# Patient Record
Sex: Male | Born: 2000 | Race: White | Hispanic: No | Marital: Single | Smoking: Never smoker
Health system: Southern US, Community
[De-identification: ages and names within clinical notes are randomized; demographics above are authoritative.]

## PROBLEM LIST (undated history)

## (undated) DIAGNOSIS — T7840XA Allergy, unspecified, initial encounter: Secondary | ICD-10-CM

## (undated) DIAGNOSIS — J45909 Unspecified asthma, uncomplicated: Secondary | ICD-10-CM

## (undated) HISTORY — DX: Allergy, unspecified, initial encounter: T78.40XA

## (undated) HISTORY — DX: Unspecified asthma, uncomplicated: J45.909

## (undated) HISTORY — PX: NO PAST SURGERIES: SHX2092

---
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
## 2019-03-24 ENCOUNTER — Other Ambulatory Visit: Payer: Self-pay

## 2019-03-25 ENCOUNTER — Ambulatory Visit (INDEPENDENT_AMBULATORY_CARE_PROVIDER_SITE_OTHER): Payer: Medicaid Other

## 2019-03-25 DIAGNOSIS — Z23 Encounter for immunization: Secondary | ICD-10-CM | POA: Diagnosis not present

## 2020-09-07 ENCOUNTER — Ambulatory Visit: Payer: Medicaid Other

## 2020-09-07 ENCOUNTER — Ambulatory Visit: Payer: Medicaid Other | Admitting: Family Medicine

## 2020-09-07 ENCOUNTER — Other Ambulatory Visit: Payer: Self-pay

## 2020-09-07 ENCOUNTER — Encounter: Payer: Self-pay | Admitting: Family Medicine

## 2020-09-07 VITALS — BP 113/71 | HR 96 | Temp 97.5°F | Ht 68.0 in | Wt 127.0 lb

## 2020-09-07 DIAGNOSIS — Z23 Encounter for immunization: Secondary | ICD-10-CM

## 2020-09-07 DIAGNOSIS — L819 Disorder of pigmentation, unspecified: Secondary | ICD-10-CM | POA: Diagnosis not present

## 2020-09-07 DIAGNOSIS — G479 Sleep disorder, unspecified: Secondary | ICD-10-CM | POA: Diagnosis not present

## 2020-09-07 DIAGNOSIS — J45990 Exercise induced bronchospasm: Secondary | ICD-10-CM | POA: Diagnosis not present

## 2020-09-07 DIAGNOSIS — Z0001 Encounter for general adult medical examination with abnormal findings: Secondary | ICD-10-CM | POA: Diagnosis not present

## 2020-09-07 DIAGNOSIS — Z0000 Encounter for general adult medical examination without abnormal findings: Secondary | ICD-10-CM

## 2020-09-07 DIAGNOSIS — R42 Dizziness and giddiness: Secondary | ICD-10-CM

## 2020-09-07 MED ORDER — ALBUTEROL SULFATE HFA 108 (90 BASE) MCG/ACT IN AERS: 2.0000 | INHALATION_SPRAY | Freq: Four times a day (QID) | RESPIRATORY_TRACT | 2 refills | Status: AC | PRN

## 2020-09-07 NOTE — Progress Notes (Signed)
Dalton Nixon is a 20 y.o. male presents to office today for annual physical exam examination.    Concerns today include: 1.  Mole on back Patient reports that his mother noticed a mole on his left upper back.  Apparently a cousin had something that looked like skin cancer and he wanted to get this further evaluated.  Not sure if the change in shape, size or color.  No reports of spontaneous bleeding  2.  Sleep difficulty Patient reports has been having ongoing sleep difficulties.  He has difficulty both falling asleep and staying asleep.  He put his phone on silent mood so does not feel that he is getting disturbed from that standpoint but often his mind will run and he will think about various things that prevents him from going to sleep.  He gets into bed around 11 but is unable to fall asleep until about 2:00 in the morning.  He does admit to drinking 2 sodas per day but he is actively working on reducing these and increasing water.  3.  Reactive airway disease/asthma Patient needs a new albuterol inhaler.  He reports feeling short of breath roughly 3 days/week.  He no longer smokes.  He was a previous vape smoker but has been off of vape for at least 2 to 3 months now.  4.  Dizziness Patient reports that he does have occasional dizziness and headaches at nighttime.  He feels that he is hydrating adequately.  Occupation: not employed, not in school, draws/paints for hobby, Marital status: single, Substance use: none Diet: fair, Exercise: no structured Last eye exam: n/a Last dental exam: needs, had wisdom teeth removed Immunizations needed:  Immunization History  Administered Date(s) Administered  . DTaP 11/15/1999, 01/18/2000, 03/13/2000, 01/27/2001, 10/20/2003  . H1N1 06/08/2008, 07/13/2008  . Hepatitis B 10/19/1999, 11/15/1999, 03/13/2000  . HiB (PRP-OMP) 11/16/1999, 01/17/2000, 03/13/2000, 01/27/2001  . IPV 11/15/1999, 01/18/2000, 01/27/2001, 10/20/2003  . Influenza,inj,Quad  PF,6+ Mos 04/06/2013, 03/25/2019  . Influenza-Unspecified 07/08/2003, 05/09/2007, 03/22/2008, 04/26/2009, 04/03/2010, 04/12/2011, 03/27/2012, 07/07/2014, 05/11/2015, 04/06/2016, 04/08/2017  . MMR 09/05/2000, 10/20/2003  . Moderna Sars-Covid-2 Vaccination 09/07/2020  . Pneumococcal Conjugate-13 11/16/1999, 01/17/2000, 03/13/2000, 01/27/2001  . Td 09/11/2010  . Tdap 09/11/2010  . Varicella 09/05/2000, 11/23/2014     Past Medical History:  Diagnosis Date  . Allergy   . Asthma    Social History   Socioeconomic History  . Marital status: Single    Spouse name: Not on file  . Number of children: Not on file  . Years of education: Not on file  . Highest education level: Not on file  Occupational History  . Not on file  Tobacco Use  . Smoking status: Never Smoker  . Smokeless tobacco: Never Used  Vaping Use  . Vaping Use: Never used  Substance and Sexual Activity  . Alcohol use: No  . Drug use: No  . Sexual activity: Never  Other Topics Concern  . Not on file  Social History Narrative  . Not on file   Social Determinants of Health   Financial Resource Strain: Not on file  Food Insecurity: Not on file  Transportation Needs: Not on file  Physical Activity: Not on file  Stress: Not on file  Social Connections: Not on file  Intimate Partner Violence: Not on file   Past Surgical History:  Procedure Laterality Date  . NO PAST SURGERIES     Family History  Problem Relation Age of Onset  . Anxiety disorder Mother  No current outpatient medications on file.  No Known Allergies   ROS: Review of Systems Pertinent items noted in HPI and remainder of comprehensive ROS otherwise negative.    Physical exam BP 113/71   Pulse 96   Temp (!) 97.5 F (36.4 C) (Temporal)   Ht $R'5\' 8"'KA$  (1.727 m)   Wt 127 lb (57.6 kg)   BMI 19.31 kg/m  General appearance: alert, cooperative, appears stated age and no distress Head: Normocephalic, without obvious abnormality,  atraumatic Eyes: negative findings: lids and lashes normal, conjunctivae and sclerae normal, corneas clear and pupils equal, round, reactive to light and accomodation Ears: normal TM's and external ear canals both ears Nose: Nares normal. Septum midline. Mucosa normal. No drainage or sinus tenderness. Throat: lips, mucosa, and tongue normal; teeth and gums normal Neck: no adenopathy, supple, symmetrical, trachea midline and thyroid not enlarged, symmetric, no tenderness/mass/nodules Back: symmetric, no curvature. ROM normal. No CVA tenderness. Lungs: clear to auscultation bilaterally Chest wall: no tenderness Heart: regular rate and rhythm, S1, S2 normal, no murmur, click, rub or gallop Abdomen: soft, non-tender; bowel sounds normal; no masses,  no organomegaly Extremities: extremities normal, atraumatic, no cyanosis or edema Pulses: 2+ and symmetric Skin: Skin color, texture, turgor normal. No rashes or lesions or 2-1/2 mm x 2 and half millimeter pigmented skin lesion noted along the left upper back that has spotty pigmentation.  No appreciable hypervascularity Lymph nodes: Cervical, supraclavicular, and axillary nodes normal. Neurologic: Alert and oriented X 3, normal strength and tone. Normal symmetric reflexes. Normal coordination and gait Psych: Affect somewhat flat.  Patient is quiet.  He is pleasant.  Eye contact fair. Depression screen Dalton Nixon 2/9 09/07/2020 03/19/2018 09/24/2017  Decreased Interest 0 2 0  Down, Depressed, Hopeless 0 0 0  PHQ - 2 Score 0 2 0  Altered sleeping 0 3 -  Tired, decreased energy 0 2 -  Change in appetite 0 0 -  Feeling bad or failure about yourself  0 0 -  Trouble concentrating 0 0 -  Moving slowly or fidgety/restless 0 0 -  Suicidal thoughts 0 0 -  PHQ-9 Score 0 7 -  Difficult doing work/chores - Not difficult at all -   No flowsheet data found.  Assessment/ Plan: Dalton Nixon here for annual physical exam.   Annual physical exam  COVID-19 vaccine  administered - Plan: Moderna SARS-CoV-2 Vaccine  Encounter for immunization - Plan: Moderna SARS-CoV-2 Vaccine  Exercise-induced asthma - Plan: albuterol (VENTOLIN HFA) 108 (90 Base) MCG/ACT inhaler  Pigmentation abnormality of skin - Plan: Ambulatory referral to Dermatology  Sleeping difficulties  Dizziness  Patient was given his code vaccination during today's visit and there were no immediate complications.  He was monitored for at least 15 minutes and discharged home with instructions for repeat injection in 4 weeks at pharmacy  Lungs were clear to auscultation.  Initially had consider controller medicine.  I am going to give him albuterol.  We discussed that if symptoms are occurring more than 3 days/week or at nighttime, will consider something like Symbicort or Flovent for seasonal use in addition to his albuterol  Lesion likely a nevi but given its asymmetric pigmentation I placed a referral to dermatology for further evaluation  I suspect that there is a big component of sleep hygiene as an issue for the sleep difficulties.  I have given him a handout on how to improve sleep hygiene.  I would like to reconvene with him in the next 3 to  4 weeks for recheck.  If persistent issues, would strongly consider something like Atarax or melatonin.  No focal neurologic deficits on exam.  I wonder if the dizziness may be related to insufficient hydration.  His ENT exam was unremarkable.  Handout on healthy lifestyle choices, including diet (rich in fruits, vegetables and lean meats and low in salt and simple carbohydrates) and exercise (at least 30 minutes of moderate physical activity daily).  Breunna Nordmann M. Lajuana Ripple, DO

## 2020-09-07 NOTE — Progress Notes (Signed)
   Covid-[facility] Vaccination [facility]  Name:  Dalton Nixon    MRN: [id number] DOB: 18-Oct-2000  09/07/2020  [name] was observed post Covid-19 immunization for 15 minutes without incident. He was provided with Vaccine Information Sheet and instruction to access the V-Safe system.   [name] was instructed to call 911 with any severe reactions post vaccine: Marland Kitchen Difficulty breathing  . Swelling of face and throat  . A fast heartbeat  . A bad rash all over body  . Dizziness and weakness

## 2020-09-07 NOTE — Patient Instructions (Signed)
Preventive Care 18-21 Years Old, Male Preventive care refers to lifestyle choices and visits with your health care provider that can promote health and wellness. At this stage in your life, you may start seeing a primary care physician instead of a pediatrician. It is important to take responsibility for your health and well-being. Preventive care for young adults includes:  A yearly physical exam. This is also called an annual wellness visit.  Regular dental and eye exams.  Immunizations.  Screening for certain conditions.  Healthy lifestyle choices, such as: ? Eating a healthy diet. ? Getting regular exercise. ? Not using drugs or products that contain nicotine and tobacco. ? Limiting alcohol use. What can I expect for my preventive care visit? Physical exam Your health care provider may check your:  Height and weight. These may be used to calculate your BMI (body mass index). BMI is a measurement that tells if you are at a healthy weight.  Heart rate and blood pressure.  Body temperature.  Skin for abnormal spots. Counseling Your health care provider may ask you questions about your:  Past medical problems.  Family's medical history.  Alcohol, tobacco, and drug use.  Home life and relationship well-being.  Access to firearms.  Emotional well-being.  Diet, exercise, and sleep habits.  Sexual activity and sexual health. What immunizations do I need? Vaccines are usually given at various ages, according to a schedule. Your health care provider will recommend vaccines for you based on your age, medical history, and lifestyle or other factors, such as travel or where you work.   What tests do I need? Blood tests  Lipid and cholesterol levels. These may be checked every 5 years starting at age 20.  Hepatitis C test.  Hepatitis B test. Screening  Genital exam to check for testicular cancer or hernias.  STD (sexually transmitted disease) testing, if you are at  risk. Other tests  Tuberculosis skin test.  Vision and hearing tests.  Skin exam. Talk with your health care provider about your test results, treatment options, and if necessary, the need for more tests. Follow these instructions at home: Eating and drinking  Eat a healthy diet that includes fresh fruits and vegetables, whole grains, lean protein, and low-fat dairy products.  Drink enough fluid to keep your urine pale yellow.  Do not drink alcohol if: ? Your health care provider tells you not to drink. ? You are under the legal drinking age. In the [place] the legal drinking age is 21.  If you drink alcohol: ? Limit how much you use to 0-2 drinks a day. ? Be aware of how much alcohol is in your drink. In the [place] one drink equals one 12 oz bottle of beer (355 mL), one 5 oz glass of wine (148 mL), or one 1 oz glass of hard liquor (44 mL).   Lifestyle  Take daily care of your teeth and gums. Brush your teeth every morning and night with fluoride toothpaste. Floss one time each day.  Stay active. Exercise for at least 30 minutes 5 or more days of the week.  Do not use any products that contain nicotine or tobacco, such as cigarettes, e-cigarettes, and chewing tobacco. If you need help quitting, ask your health care provider.  Do not use drugs.  If you are sexually active, practice safe sex. Use a condom or other form of protection to prevent STIs (sexually transmitted infections).  Find healthy ways to cope with stress, such as: ? Meditation, yoga,   or listening to music. ? Journaling. ? Talking to a trusted person. ? Spending time with friends and family. Safety  Always wear your seat belt while driving or riding in a vehicle.  Do not drive: ? If you have been drinking alcohol. Do not ride with someone who has been drinking. ? When you are tired or distracted. ? While texting.  Wear a helmet and other protective equipment during sports activities.  If you have  firearms in your house, make sure you follow all gun safety procedures.  Seek help if you have been bullied, physically abused, or sexually abused.  Use the Internet responsibly to avoid dangers, such as online bullying and online sex predators. What's next?  Go to your health care provider once a year for an annual wellness visit.  Ask your health care provider how often you should have your eyes and teeth checked.  Stay up to date on all vaccines. This information is not intended to replace advice given to you by your health care provider. Make sure you discuss any questions you have with your health care provider. Document Revised: 03/04/2019 Document Reviewed: 06/12/2018 Elsevier Patient Education  2021 Elsevier Inc.  

## 2020-09-08 ENCOUNTER — Telehealth: Payer: Self-pay

## 2020-09-08 NOTE — Telephone Encounter (Signed)
Pts mom called stating that they want pt to be referred for dermatology somewhere in [place] or [place]; not in [place]. Says pt was unsure when he was asked at appt yesterday.

## 2020-09-08 NOTE — Telephone Encounter (Signed)
Dalton Nixon, referral was placed yesterday by Dr. Reece Agar

## 2020-10-07 ENCOUNTER — Ambulatory Visit: Payer: Medicaid Other | Admitting: Family Medicine

## 2020-11-01 ENCOUNTER — Ambulatory Visit: Payer: Medicaid Other | Admitting: Family Medicine

## 2020-11-01 NOTE — Progress Notes (Deleted)
   Subjective: CC: Bronchospasm, Insomnia PCP: Raliegh Ip, DO URK:YHCW Judie Petit [place] is a 20 y.o. male presenting to clinic today for:  1. Bronchospasm ***  2. Insomnia ***   ROS: Per HPI  No Known Allergies Past Medical History:  Diagnosis Date  . Allergy   . Asthma     Current Outpatient Medications:  .  albuterol (VENTOLIN HFA) 108 (90 Base) MCG/ACT inhaler, Inhale 2 puffs into the lungs every 6 (six) hours as needed for wheezing or shortness of breath., Disp: 1 each, Rfl: 2 Social History   Socioeconomic History  . Marital status: Single    Spouse name: Not on file  . Number of children: Not on file  . Years of education: Not on file  . Highest education level: Not on file  Occupational History  . Not on file  Tobacco Use  . Smoking status: Never Smoker  . Smokeless tobacco: Never Used  Vaping Use  . Vaping Use: Never used  Substance and Sexual Activity  . Alcohol use: No  . Drug use: No  . Sexual activity: Never  Other Topics Concern  . Not on file  Social History Narrative  . Not on file   Social Determinants of Health   Financial Resource Strain: Not on file  Food Insecurity: Not on file  Transportation Needs: Not on file  Physical Activity: Not on file  Stress: Not on file  Social Connections: Not on file  Intimate Partner Violence: Not on file   Family History  Problem Relation Age of Onset  . Anxiety disorder Mother     Objective: Office vital signs reviewed. There were no vitals taken for this visit.  Physical Examination:  General: Awake, alert, *** nourished, No acute distress HEENT: Normal    Neck: No masses palpated. No lymphadenopathy    Ears: Tympanic membranes intact, normal light reflex, no erythema, no bulging    Eyes: PERRLA, extraocular membranes intact, sclera ***    Nose: nasal turbinates moist, *** nasal discharge    Throat: moist mucus membranes, no erythema, *** tonsillar exudate.  Airway is patent Cardio:  regular rate and rhythm, S1S2 heard, no murmurs appreciated Pulm: clear to auscultation bilaterally, no wheezes, rhonchi or rales; normal work of breathing on room air GI: soft, non-tender, non-distended, bowel sounds present x4, no hepatomegaly, no splenomegaly, no masses GU: external vaginal tissue ***, cervix ***, *** punctate lesions on cervix appreciated, *** discharge from cervical os, *** bleeding, *** cervical motion tenderness, *** abdominal/ adnexal masses Extremities: warm, well perfused, No edema, cyanosis or clubbing; +*** pulses bilaterally MSK: *** gait and *** station Skin: dry; intact; no rashes or lesions Neuro: *** Strength and light touch sensation grossly intact, *** DTRs ***/4  Assessment/ Plan: 20 y.o. male   ***  No orders of the defined types were placed in this encounter.  No orders of the defined types were placed in this encounter.    Raliegh Ip, DO [facility] [phone]

## 2021-01-17 ENCOUNTER — Ambulatory Visit: Payer: Medicaid Other | Admitting: Family Medicine

## 2021-03-03 ENCOUNTER — Ambulatory Visit: Payer: Medicaid Other | Admitting: Family Medicine

## 2021-11-15 ENCOUNTER — Encounter: Payer: Self-pay | Admitting: Family Medicine

## 2021-11-15 ENCOUNTER — Ambulatory Visit: Payer: BC Managed Care – PPO | Admitting: Family Medicine

## 2021-11-15 VITALS — BP 105/55 | HR 64 | Temp 97.6°F | Ht 68.0 in | Wt 123.0 lb

## 2021-11-15 DIAGNOSIS — F17200 Nicotine dependence, unspecified, uncomplicated: Secondary | ICD-10-CM

## 2021-11-15 DIAGNOSIS — Z1159 Encounter for screening for other viral diseases: Secondary | ICD-10-CM

## 2021-11-15 DIAGNOSIS — R229 Localized swelling, mass and lump, unspecified: Secondary | ICD-10-CM | POA: Diagnosis not present

## 2021-11-15 DIAGNOSIS — Z13220 Encounter for screening for lipoid disorders: Secondary | ICD-10-CM

## 2021-11-15 DIAGNOSIS — Z130 Encounter for screening for diseases of the blood and blood-forming organs and certain disorders involving the immune mechanism: Secondary | ICD-10-CM | POA: Diagnosis not present

## 2021-11-15 DIAGNOSIS — Z114 Encounter for screening for human immunodeficiency virus [HIV]: Secondary | ICD-10-CM | POA: Diagnosis not present

## 2021-11-15 DIAGNOSIS — Z13228 Encounter for screening for other metabolic disorders: Secondary | ICD-10-CM | POA: Diagnosis not present

## 2021-11-15 NOTE — Progress Notes (Signed)
? ?Subjective: ?CC: Hand swelling ?PCP: Janora Norlander, DO ?NAT:FTDD Dalton Nixon is a 21 y.o. male presenting to clinic today for: ? ?1.  Swelling ?Patient reports that he has had some mild swelling at just below the PIP joint on the right hand.  He is right-hand dominant.  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  He denies any pain, sensory changes, weakness in that hand.  No itching.  No significant discoloration.  He feels that it is a callus but his mother was really worried that he is here to have that checked out today ? ?His mother also asked for "full blood work".  He denies any concerns today however. ? ? ?ROS: Per HPI ? ?No Known Allergies ?Past Medical History:  ?Diagnosis Date  ? Allergy   ? Asthma   ? ? ?Current Outpatient Medications:  ?  albuterol (VENTOLIN HFA) 108 (90 Base) MCG/ACT inhaler, Inhale 2 puffs into the lungs every 6 (six) hours as needed for wheezing or shortness of breath., Disp: 1 each, Rfl: 2 ?Social History  ? ?Socioeconomic History  ? Marital status: Single  ?  Spouse name: Not on file  ? Number of children: Not on file  ? Years of education: Not on file  ? Highest education level: Not on file  ?Occupational History  ? Not on file  ?Tobacco Use  ? Smoking status: Never  ? Smokeless tobacco: Never  ?Vaping Use  ? Vaping Use: Never used  ?Substance and Sexual Activity  ? Alcohol use: No  ? Drug use: No  ? Sexual activity: Never  ?Other Topics Concern  ? Not on file  ?Social History Narrative  ? Not on file  ? ?Social Determinants of Health  ? ?Financial Resource Strain: Not on file  ?Food Insecurity: Not on file  ?Transportation Needs: Not on file  ?Physical Activity: Not on file  ?Stress: Not on file  ?Social Connections: Not on file  ?Intimate Partner Violence: Not on file  ? ?Family History  ?Problem Relation Age of Onset  ? Anxiety disorder Mother   ? ? ?Objective: ?Office vital signs reviewed. ?BP (!) 105/55    Pulse 64   Temp 97.6 ?F (36.4 ?C)   Ht _0  (1.727 m)   Wt 123 lb (55.8 kg)   SpO2 96%   BMI 18.70 kg/m?  ? ?Physical Examination:  ?General: Awake, alert, thin male, No acute distress ?HEENT: Sclera white.  Dentition fair.  Moist mucous membranes ?Cardio: regular rate and rhythm, S1S2 heard, no murmurs appreciated ?Pulm: clear to auscultation bilaterally, no wheezes, rhonchi or rales; normal work of breathing on room air ?MSK: Normal gait and station ? Right hand" he has a very small area of soft tissue swelling noted on the dorsal aspect of the right hand just inferior to the PIP joint of the second digit.  There is no tenderness to palpation.  There is only mild hyperpigmentation in this area with associated thickening of the skin consistent with callus formation. ? ?Assessment/ Plan: ?21 y.o. male  ? ?Soft tissue swelling of non-joint region - Plan: CMP14+EGFR, TSH, CBC, CBC, TSH, CMP14+EGFR ? ?Smoker - Plan: CBC, CBC ? ?Screening, anemia, deficiency, iron - Plan: CBC, CBC ? ?Screening for metabolic disorder ? ?Screening, lipid - Plan: Lipid panel, Lipid panel ? ?Screening for HIV (human immunodeficiency virus) - Plan: HIV antibody (with reflex), HIV antibody (with reflex) ? ?Encounter for hepatitis C screening test  for low risk patient - Plan: Hepatitis C antibody, Hepatitis C antibody ? ?Suspect that soft tissue swelling is reactive in the setting of his work obligations.  We discussed that if it starts bothering him he certainly could use ice to the affected area as well as Voltaren gel to reduce swelling.  Does not appear to be involving the joint at all. ? ?Nonfasting labs obtained for patient as per his mother's request ? ?No orders of the defined types were placed in this encounter. ? ?No orders of the defined types were placed in this encounter. ? ? ? ?Janora Norlander, DO ?[place] ?[phone] ? ? ?

## 2021-11-15 NOTE — Patient Instructions (Signed)
Labs have been ordered but we discussed that this may be abnormal since you ate a biscuit this morning. ? ?Soft tissue swelling noted adjacent to your finger joint is likely reactive.  I would not be surprised if he developed a little cyst in this region.  It is most ongoing due to the repetitive irritation that it is getting with your work duties.  You can use ice in this area to reduce swelling.  You may use topical Voltaren gel if needed as well up to 2 or 3 times a day.  However if its not causing you any pain or irritation there is really no reason for you to do anything at all besides watch it ?

## 2021-11-16 LAB — LIPID PANEL
Chol/HDL Ratio: 2.4 ratio (ref 0.0–5.0)
Cholesterol, Total: 96 mg/dL — ABNORMAL LOW (ref 100–199)
HDL: 40 mg/dL (ref >39)
LDL Chol Calc (NIH): 44 mg/dL (ref 0–99)
Triglycerides: 44 mg/dL (ref 0–149)
VLDL Cholesterol Cal: 12 mg/dL (ref 5–40)

## 2021-11-16 LAB — CMP14+EGFR
ALT: 10 IU/L (ref 0–44)
AST: 15 IU/L (ref 0–40)
Albumin/Globulin Ratio: 2.2 (ref 1.2–2.2)
Albumin: 4.9 g/dL (ref 4.1–5.2)
Alkaline Phosphatase: 64 IU/L (ref 44–121)
BUN/Creatinine Ratio: 11 (ref 9–20)
BUN: 9 mg/dL (ref 6–20)
Bilirubin Total: 0.6 mg/dL (ref 0.0–1.2)
CO2: 25 mmol/L (ref 20–29)
Calcium: 9.4 mg/dL (ref 8.7–10.2)
Chloride: 103 mmol/L (ref 96–106)
Creatinine, Ser: 0.82 mg/dL (ref 0.76–1.27)
Globulin, Total: 2.2 g/dL (ref 1.5–4.5)
Glucose: 95 mg/dL (ref 70–99)
Potassium: 4.3 mmol/L (ref 3.5–5.2)
Sodium: 141 mmol/L (ref 134–144)
Total Protein: 7.1 g/dL (ref 6.0–8.5)
eGFR: 127 mL/min/{1.73_m2} (ref >59)

## 2021-11-16 LAB — CBC
Hematocrit: 43.3 % (ref 37.5–51.0)
Hemoglobin: 14.6 g/dL (ref 13.0–17.7)
MCH: 31.1 pg (ref 26.6–33.0)
MCHC: 33.7 g/dL (ref 31.5–35.7)
MCV: 92 fL (ref 79–97)
Platelets: 204 10*3/uL (ref 150–450)
RBC: 4.69 x10E6/uL (ref 4.14–5.80)
RDW: 12.6 % (ref 11.6–15.4)
WBC: 3.5 10*3/uL (ref 3.4–10.8)

## 2021-11-16 LAB — TSH: TSH: 2.97 u[IU]/mL (ref 0.450–4.500)

## 2021-11-16 LAB — HIV ANTIBODY (ROUTINE TESTING W REFLEX): HIV Screen 4th Generation wRfx: NONREACTIVE

## 2021-11-16 LAB — HEPATITIS C ANTIBODY: Hep C Virus Ab: NONREACTIVE

## 2022-02-16 ENCOUNTER — Encounter: Payer: Self-pay | Admitting: Nurse Practitioner

## 2022-02-16 ENCOUNTER — Ambulatory Visit: Payer: BC Managed Care – PPO | Admitting: Nurse Practitioner

## 2022-02-16 DIAGNOSIS — R051 Acute cough: Secondary | ICD-10-CM

## 2022-02-16 MED ORDER — AMOXICILLIN-POT CLAVULANATE 875-125 MG PO TABS: 1.0000 | ORAL_TABLET | Freq: Two times a day (BID) | ORAL | 0 refills | Status: AC

## 2022-02-16 NOTE — Patient Instructions (Signed)
Sore Throat When you have a sore throat, your throat may feel: Tender. Burning. Irritated. Scratchy. Painful when you swallow. Painful when you talk. Many things can cause a sore throat, such as: An infection. Allergies. Dry air. Smoke or pollution. Radiation treatment for cancer. Gastroesophageal reflux disease (GERD). A tumor. A sore throat can be the first sign of another sickness. It can happen with other problems, like: Coughing. Sneezing. Fever. Swelling of the glands in the neck. Most sore throats go away without treatment. Follow these instructions at home:     Medicines Take over-the-counter and prescription medicines only as told by your doctor. Children often get sore throats. Do not give your child aspirin. Use throat sprays to soothe your throat as told by your health care provider. Managing pain To help with pain: Sip warm liquids, such as broth, herbal tea, or warm water. Eat or drink cold or frozen liquids, such as frozen ice pops. Rinse your mouth (gargle) with a salt water mixture 3-4 times a day or as needed. To make salt water, dissolve -1 tsp (3-6 g) of salt in 1 cup (237 mL) of warm water. Do not swallow this mixture. Suck on hard candy or throat lozenges. Put a cool-mist humidifier in your bedroom at night. Sit in the bathroom with the door closed for 5-10 minutes while you run hot water in the shower. General instructions Do not smoke or use any products that contain nicotine or tobacco. If you need help quitting, ask your doctor. Get plenty of rest. Drink enough fluid to keep your pee (urine) pale yellow. Wash your hands often for at least 20 seconds with soap and water. If soap and water are not available, use hand sanitizer. Contact a doctor if: You have a fever for more than 2-3 days. You keep having symptoms for more than 2-3 days. Your throat does not get better in 7 days. You have a fever and your symptoms suddenly get worse. Your  child who is 3 months to 27 years old has a temperature of 102.77F (39C) or higher. Get help right away if: You have trouble breathing. You cannot swallow fluids, soft foods, or your spit. You have swelling in your throat or neck that gets worse. You feel like you may vomit (nauseous) and this feeling lasts a long time. You cannot stop vomiting. These symptoms may be an emergency. Get help right away. Call your local emergency services (911 in the [place] Do not wait to see if the symptoms will go away. Do not drive yourself to the hospital. Summary A sore throat is a painful, burning, irritated, or scratchy throat. Many things can cause a sore throat. Take over-the-counter medicines only as told by your doctor. Get plenty of rest. Drink enough fluid to keep your pee (urine) pale yellow. Contact a doctor if your symptoms get worse or your sore throat does not get better within 7 days. This information is not intended to replace advice given to you by your health care provider. Make sure you discuss any questions you have with your health care provider. Document Revised: 09/14/2020 Document Reviewed: 09/14/2020 Elsevier Patient Education  2023 Elsevier Inc. Sinus Infection, Adult A sinus infection is soreness and swelling (inflammation) of your sinuses. Sinuses are hollow spaces in the bones around your face. They are located: Around your eyes. In the middle of your forehead. Behind your nose. In your cheekbones. Your sinuses and nasal passages are lined with a fluid called mucus. Mucus drains out of  your sinuses. Swelling can trap mucus in your sinuses. This lets germs (bacteria, virus, or fungus) grow, which leads to infection. Most of the time, this condition is caused by a virus. What are the causes? Allergies. Asthma. Germs. Things that block your nose or sinuses. Growths in the nose (nasal polyps). Chemicals or irritants in the air. A fungus. This is rare. What increases the  risk? Having a weak body defense system (immune system). Doing a lot of swimming or diving. Using nasal sprays too much. Smoking. What are the signs or symptoms? The main symptoms of this condition are pain and a feeling of pressure around the sinuses. Other symptoms include: Stuffy nose (congestion). This may make it hard to breathe through your nose. Runny nose (drainage). Soreness, swelling, and warmth in the sinuses. A cough that may get worse at night. Being unable to smell and taste. Mucus that collects in the throat or the back of the nose (postnasal drip). This may cause a sore throat or bad breath. Being very tired (fatigued). A fever. How is this diagnosed? Your symptoms. Your medical history. A physical exam. Tests to find out if your condition is short-term (acute) or long-term (chronic). Your doctor may: Check your nose for growths (polyps). Check your sinuses using a tool that has a light on one end (endoscope). Check for allergies or germs. Do imaging tests, such as an MRI or CT scan. How is this treated? Treatment for this condition depends on the cause and whether it is short-term or long-term. If caused by a virus, your symptoms should go away on their own within 10 days. You may be given medicines to relieve symptoms. They include: Medicines that shrink swollen tissue in the nose. A spray that treats swelling of the nostrils. Rinses that help get rid of thick mucus in your nose (nasal saline washes). Medicines that treat allergies (antihistamines). Over-the-counter pain relievers. If caused by bacteria, your doctor may wait to see if you will get better without treatment. You may be given antibiotic medicine if you have: A very bad infection. A weak body defense system. If caused by growths in the nose, surgery may be needed. Follow these instructions at home: Medicines Take, use, or apply over-the-counter and prescription medicines only as told by your  doctor. These may include nasal sprays. If you were prescribed an antibiotic medicine, take it as told by your doctor. Do not stop taking it even if you start to feel better. Hydrate and humidify  Drink enough water to keep your pee (urine) pale yellow. Use a cool mist humidifier to keep the humidity level in your home above 50%. Breathe in steam for 10-15 minutes, 3-4 times a day, or as told by your doctor. You can do this in the bathroom while a hot shower is running. Try not to spend time in cool or dry air. Rest Rest as much as you can. Sleep with your head raised (elevated). Make sure you get enough sleep each night. General instructions  Put a warm, moist washcloth on your face 3-4 times a day, or as often as told by your doctor. Use nasal saline washes as often as told by your doctor. Wash your hands often with soap and water. If you cannot use soap and water, use hand sanitizer. Do not smoke. Avoid being around people who are smoking (secondhand smoke). Keep all follow-up visits. Contact a doctor if: You have a fever. Your symptoms get worse. Your symptoms do not get better within  10 days. Get help right away if: You have a very bad headache. You cannot stop vomiting. You have very bad pain or swelling around your face or eyes. You have trouble seeing. You feel confused. Your neck is stiff. You have trouble breathing. These symptoms may be an emergency. Get help right away. Call 911. Do not wait to see if the symptoms will go away. Do not drive yourself to the hospital. Summary A sinus infection is swelling of your sinuses. Sinuses are hollow spaces in the bones around your face. This condition is caused by tissues in your nose that become inflamed or swollen. This traps germs. These can lead to infection. If you were prescribed an antibiotic medicine, take it as told by your doctor. Do not stop taking it even if you start to feel better. Keep all follow-up visits. This  information is not intended to replace advice given to you by your health care provider. Make sure you discuss any questions you have with your health care provider. Document Revised: 05/23/2021 Document Reviewed: 05/23/2021 Elsevier Patient Education  2023 [employer].

## 2022-02-16 NOTE — Progress Notes (Signed)
   Virtual Visit  Note Due to COVID-19 pandemic this visit was conducted virtually. This visit type was conducted due to national recommendations for restrictions regarding the COVID-19 Pandemic (e.g. social distancing, sheltering in place) in an effort to limit this patient's exposure and mitigate transmission in our community. All issues noted in this document were discussed and addressed.  A physical exam was not performed with this format.  I connected with Dalton Nixon on 02/16/22 at 11:30 am by telephone and verified that I am speaking with the correct person using two identifiers. Dalton Nixon is currently located at home during visit. The provider, Daryll Drown, NP is located in their office at time of visit.  I discussed the limitations, risks, security and privacy concerns of performing an evaluation and management service by telephone and the availability of in person appointments. I also discussed with the patient that there may be a patient responsible charge related to this service. The patient expressed understanding and agreed to proceed.   History and Present Illness:   Sore Throat  This is a new problem. The current episode started yesterday. The problem has been gradually worsening. Neither side of throat is experiencing more pain than the other. The maximum temperature recorded prior to his arrival was 100.4 - 100.9 F. The pain is moderate. Associated symptoms include congestion, coughing and swollen glands. He has had exposure to strep.      Review of Systems  Constitutional:  Positive for malaise/fatigue. Negative for chills and fever.  HENT:  Positive for congestion.   Respiratory:  Positive for cough.   Cardiovascular: Negative.   Genitourinary: Negative.   Skin:  Negative for itching and rash.  All other systems reviewed and are negative.    Observations/Objective: Tele-visit patient not in distress  Assessment and Plan: Patient presents with symptoms of  URI exposed to streps from family.I will treat patient apophylactically with antibiotics. Take meds as prescribed - Use a cool mist humidifier  -Use saline nose sprays frequently -Force fluids -For fever or aches or pains- take Tylenol or ibuprofen. -If symptoms do not improve, she may need to be COVID tested to rule this out Follow up with worsening unresolved symptoms   Follow Up Instructions: Follow up with worsening unresolved symptoms     I discussed the assessment and treatment plan with the patient. The patient was provided an opportunity to ask questions and all were answered. The patient agreed with the plan and demonstrated an understanding of the instructions.   The patient was advised to call back or seek an in-person evaluation if the symptoms worsen or if the condition fails to improve as anticipated.  The above assessment and management plan was discussed with the patient. The patient verbalized understanding of and has agreed to the management plan. Patient is aware to call the clinic if symptoms persist or worsen. Patient is aware when to return to the clinic for a follow-up visit. Patient educated on when it is appropriate to go to the emergency department.   Time call ended:  11:41 am   I provided 11 minutes of  non face-to-face time during this encounter.    Daryll Drown, NP

## 2023-07-31 ENCOUNTER — Ambulatory Visit
Admission: EM | Admit: 2023-07-31 | Discharge: 2023-07-31 | Disposition: A | Discharge status: Other Acute Inpatient Hospital | Payer: Self-pay

## 2023-07-31 DIAGNOSIS — S61211A Laceration without foreign body of left index finger without damage to nail, initial encounter: Secondary | ICD-10-CM | POA: Diagnosis not present

## 2023-07-31 DIAGNOSIS — W260XXA Contact with knife, initial encounter: Secondary | ICD-10-CM | POA: Diagnosis not present
